# Patient Record
Sex: Female | Born: 1985 | Race: White | Hispanic: No | Marital: Single | Smoking: Former smoker
Health system: Southern US, Community
[De-identification: ages and names within clinical notes are randomized; demographics above are authoritative.]

## PROBLEM LIST (undated history)

## (undated) DIAGNOSIS — F32A Depression, unspecified: Secondary | ICD-10-CM

## (undated) DIAGNOSIS — O0930 Supervision of pregnancy with insufficient antenatal care, unspecified trimester: Secondary | ICD-10-CM

## (undated) DIAGNOSIS — F419 Anxiety disorder, unspecified: Secondary | ICD-10-CM

## (undated) DIAGNOSIS — F329 Major depressive disorder, single episode, unspecified: Secondary | ICD-10-CM

## (undated) DIAGNOSIS — E669 Obesity, unspecified: Secondary | ICD-10-CM

## (undated) HISTORY — DX: Supervision of pregnancy with insufficient antenatal care, unspecified trimester: O09.30

## (undated) HISTORY — DX: Depression, unspecified: F32.A

## (undated) HISTORY — DX: Major depressive disorder, single episode, unspecified: F32.9

## (undated) HISTORY — PX: WISDOM TOOTH EXTRACTION: SHX21

## (undated) HISTORY — DX: Anxiety disorder, unspecified: F41.9

## (undated) HISTORY — DX: Obesity, unspecified: E66.9

---

## 2012-06-14 NOTE — L&D Delivery Note (Signed)
Delivery Note At 10:37 PM a viable female was delivered via Vaginal, Spontaneous Delivery (Presentation: Left Occiput Anterior).  Shoulders noted to be tight following 'turtle sign'; shoulder dystocia released by Suprapubic pressure, McRoberts maneuver, and Woodscrew, lasting 1 minute 20 seconds total. APGAR: 1, 9; weight: pending.  Placenta status: Intact, Spontaneous.  Cord:  3 vessel cord with the following complications: None.  Cord pH: 7.322.    Anesthesia: Epidural  Episiotomy: None Lacerations: None Est. Blood Loss (mL): 350  Mom to postpartum.  Baby to NICU.  Selena Lesser 03/14/2013, 11:06 PM  I have seen and examined this patient and I agree with the above. SHAW, KIMBERLY 3:10 AM 03/15/2013

## 2013-01-15 ENCOUNTER — Other Ambulatory Visit (HOSPITAL_COMMUNITY): Payer: Self-pay | Admitting: Family

## 2013-01-15 DIAGNOSIS — Z3689 Encounter for other specified antenatal screening: Secondary | ICD-10-CM

## 2013-01-15 LAB — OB RESULTS CONSOLE RPR: RPR: NONREACTIVE

## 2013-01-15 LAB — OB RESULTS CONSOLE ANTIBODY SCREEN: Antibody Screen: NEGATIVE

## 2013-01-15 LAB — OB RESULTS CONSOLE ABO/RH: RH Type: NEGATIVE

## 2013-01-15 LAB — OB RESULTS CONSOLE HEPATITIS B SURFACE ANTIGEN: Hepatitis B Surface Ag: NEGATIVE

## 2013-01-16 ENCOUNTER — Ambulatory Visit (HOSPITAL_COMMUNITY)
Admission: RE | Admit: 2013-01-16 | Discharge: 2013-01-16 | Disposition: A | Discharge status: Home / Self Care | Payer: Medicaid Other | Source: Ambulatory Visit | Attending: Family | Admitting: Family

## 2013-01-16 DIAGNOSIS — Z3689 Encounter for other specified antenatal screening: Secondary | ICD-10-CM | POA: Insufficient documentation

## 2013-01-18 LAB — OB RESULTS CONSOLE GBS: GBS: POSITIVE

## 2013-01-29 ENCOUNTER — Other Ambulatory Visit (HOSPITAL_COMMUNITY): Payer: Self-pay | Admitting: Physician Assistant

## 2013-01-29 DIAGNOSIS — Z0489 Encounter for examination and observation for other specified reasons: Secondary | ICD-10-CM

## 2013-02-01 ENCOUNTER — Ambulatory Visit (HOSPITAL_COMMUNITY)
Admission: RE | Admit: 2013-02-01 | Discharge: 2013-02-01 | Disposition: A | Discharge status: Home / Self Care | Payer: Medicaid Other | Source: Ambulatory Visit | Attending: Physician Assistant | Admitting: Physician Assistant

## 2013-02-01 DIAGNOSIS — Z0489 Encounter for examination and observation for other specified reasons: Secondary | ICD-10-CM

## 2013-02-01 DIAGNOSIS — Z3689 Encounter for other specified antenatal screening: Secondary | ICD-10-CM | POA: Insufficient documentation

## 2013-02-01 DIAGNOSIS — O0930 Supervision of pregnancy with insufficient antenatal care, unspecified trimester: Secondary | ICD-10-CM | POA: Insufficient documentation

## 2013-03-05 ENCOUNTER — Other Ambulatory Visit (HOSPITAL_COMMUNITY): Payer: Self-pay | Admitting: Physician Assistant

## 2013-03-05 DIAGNOSIS — O480 Post-term pregnancy: Secondary | ICD-10-CM

## 2013-03-08 ENCOUNTER — Encounter (HOSPITAL_COMMUNITY): Payer: Self-pay

## 2013-03-08 ENCOUNTER — Other Ambulatory Visit (HOSPITAL_COMMUNITY): Payer: Self-pay | Admitting: Physician Assistant

## 2013-03-08 ENCOUNTER — Ambulatory Visit (HOSPITAL_COMMUNITY)
Admission: RE | Admit: 2013-03-08 | Discharge: 2013-03-08 | Disposition: A | Discharge status: Home / Self Care | Payer: Medicaid Other | Source: Ambulatory Visit | Attending: Physician Assistant | Admitting: Physician Assistant

## 2013-03-08 ENCOUNTER — Ambulatory Visit (HOSPITAL_COMMUNITY): Admission: RE | Admit: 2013-03-08 | Payer: Medicaid Other | Source: Ambulatory Visit

## 2013-03-08 DIAGNOSIS — Z3689 Encounter for other specified antenatal screening: Secondary | ICD-10-CM | POA: Insufficient documentation

## 2013-03-08 DIAGNOSIS — O480 Post-term pregnancy: Secondary | ICD-10-CM

## 2013-03-08 IMAGING — US US FETAL BPP W/O NONSTRESS
1 series · 13 of 16 positions shown · non-contrast
Comparison: none

[Series 1: us fetal bpp w/o nonstress · non-contrast · 16 acquisitions, 13 frames shown]
[im 1/16]
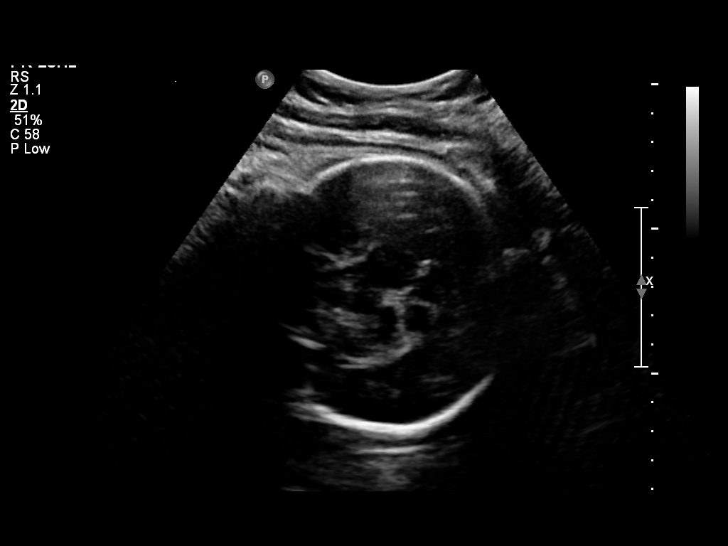
[im 2/16]
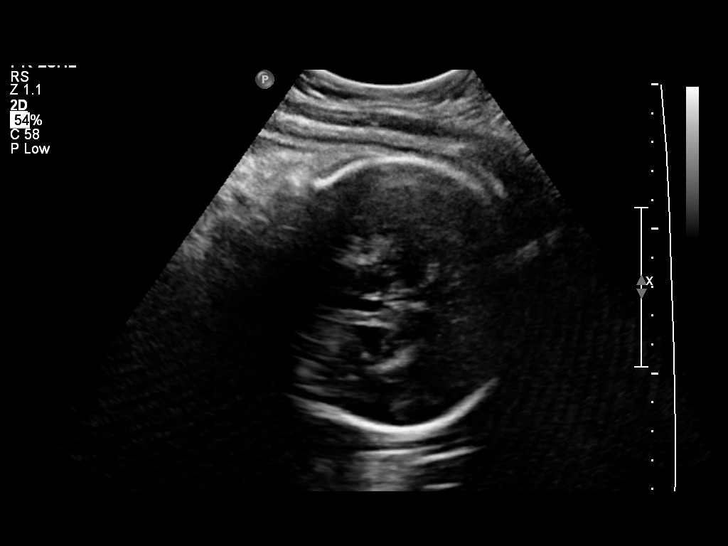
[im 4/16]
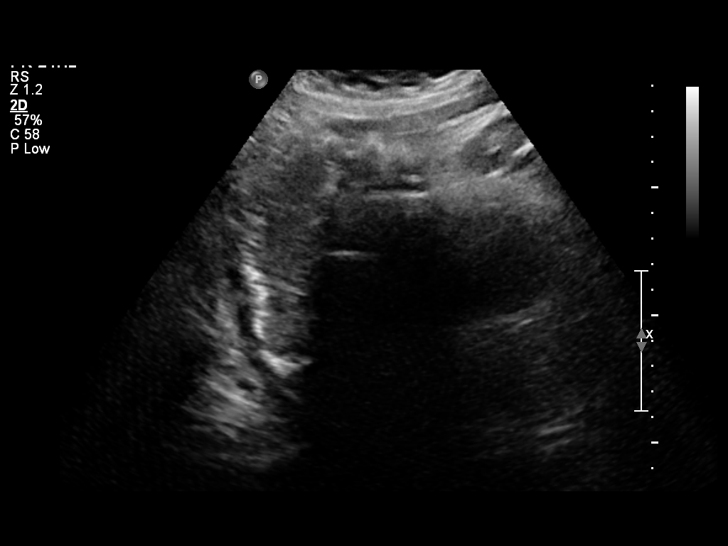
[im 5/16]
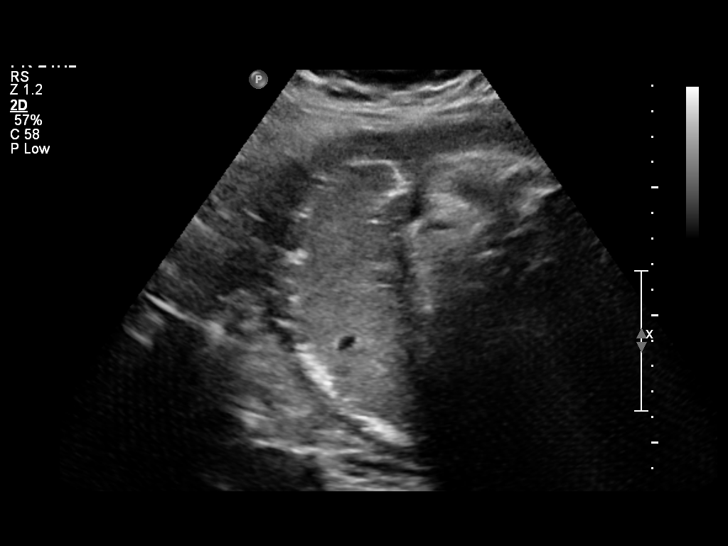
[im 6/16]
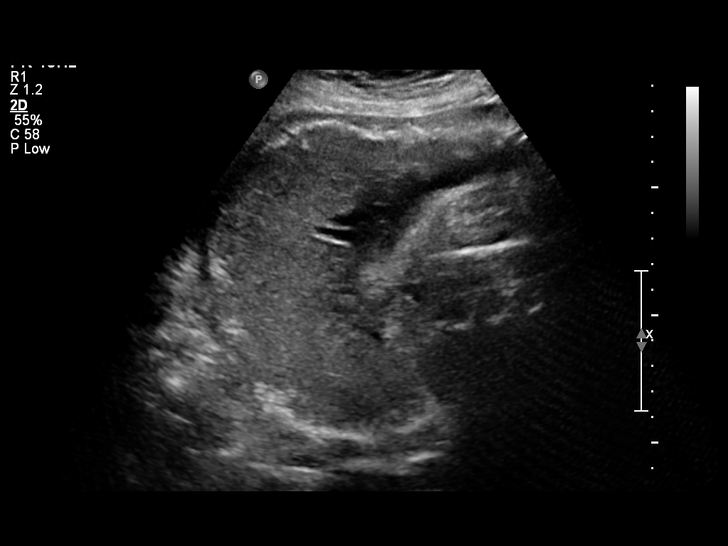
[im 7/16]
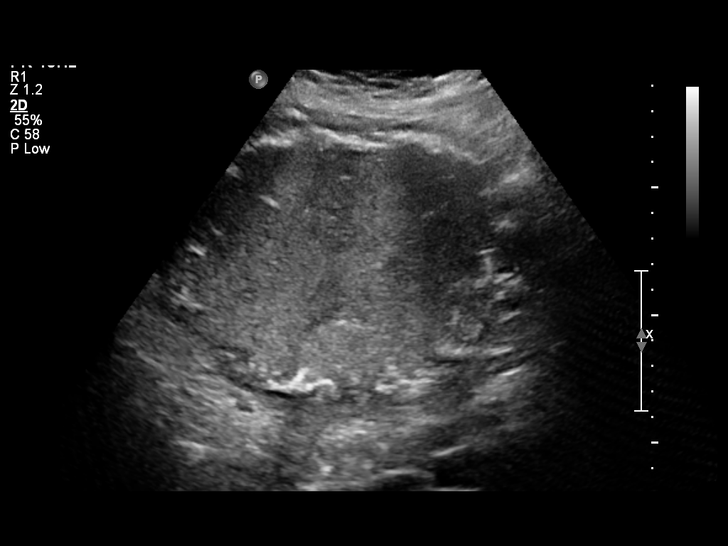
[im 9/16]
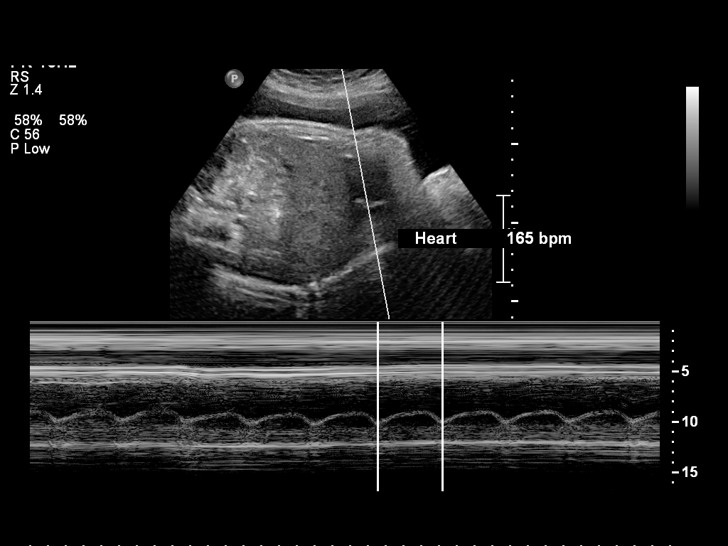
[im 10/16]
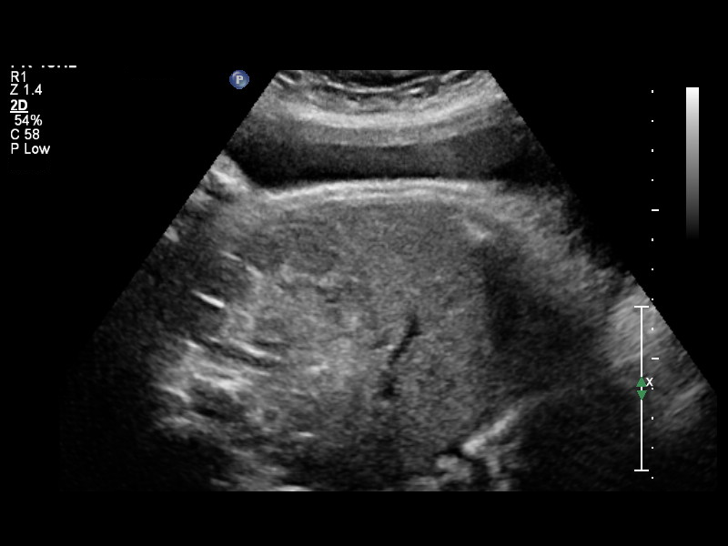
[im 11/16]
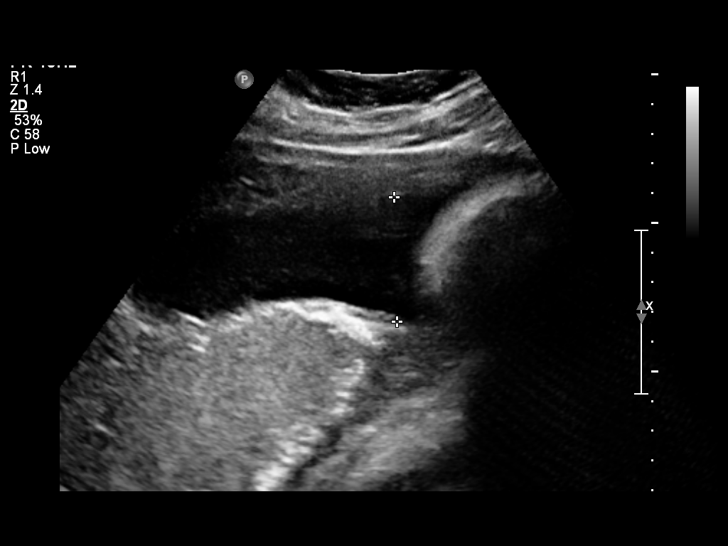
[im 12/16]
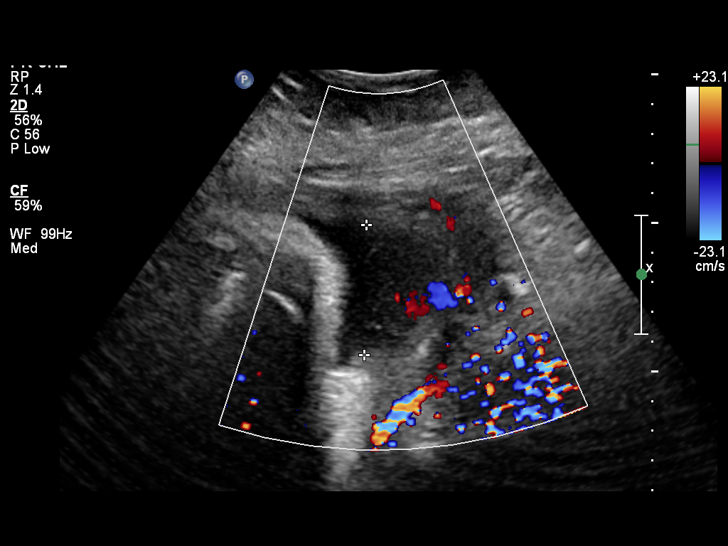
[im 13/16]
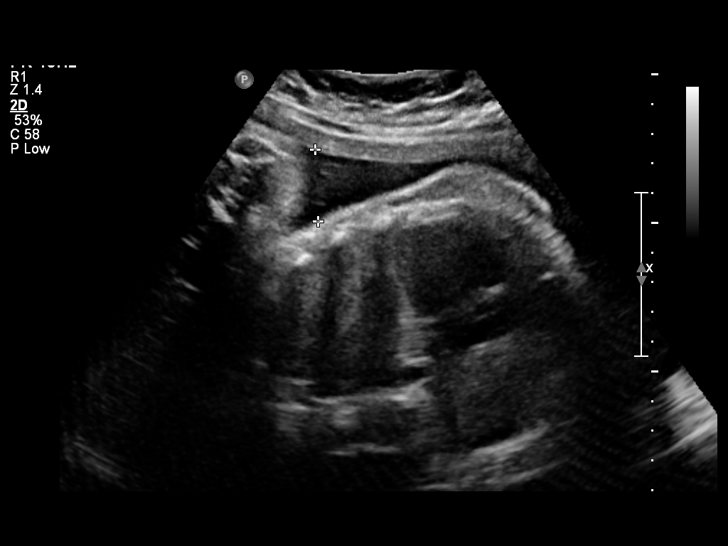
[im 15/16]
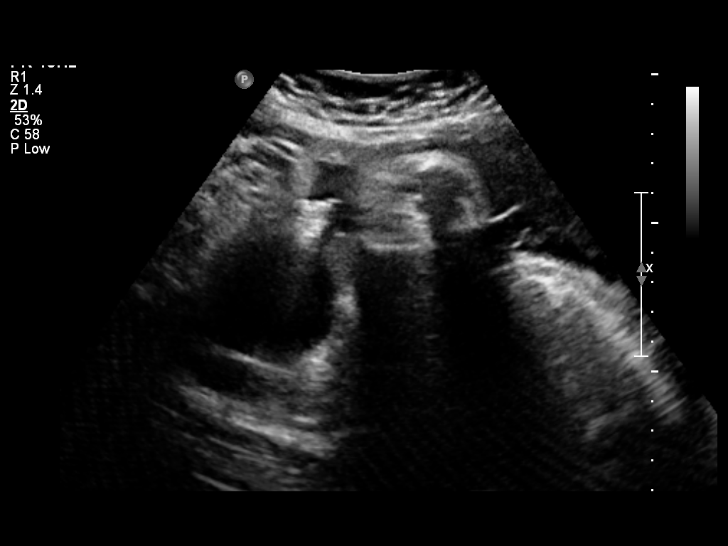
[im 16/16]
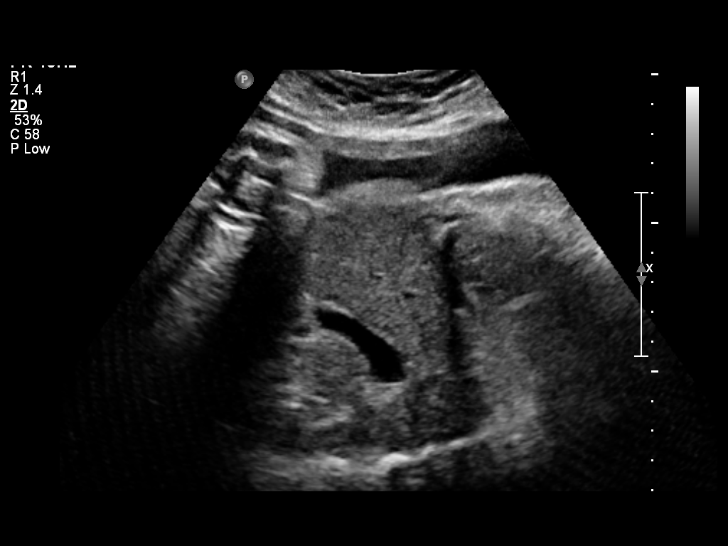

[13 of 16 positions shown; findings below may reference images not displayed]

OBSTETRICS REPORT
                      (Signed Final [DATE] [DATE])

Service(s) Provided

 [HOSPITAL]                                         76815.0
Indications

 Postdate pregnancy (40-42 weeks)
Fetal Evaluation

 Num Of Fetuses:    1
 Fetal Heart Rate:  165                          bpm
 Cardiac Activity:  Observed
 Presentation:      Cephalic
 Placenta:          Posterior Fundal, above
                    cervical os

 Amniotic Fluid
 AFI FV:      Subjectively within normal limits
 AFI Sum:     14.04   cm       61  %Tile      Larg Pckt:   4.36  cm
 RUQ:   2.41    cm   RLQ:    4.17   cm    LUQ:   3.1     cm    LLQ:   4.36    cm
Biophysical Evaluation

 Amniotic F.V:   Within normal limits       F. Tone:         Observed
 F. Movement:    Observed                   Score:           [DATE]
 F. Breathing:   Observed
Gestational Age

 LMP:           35w 4d        Date:  [DATE]                 EDD:   [DATE]
 Best:          40w 3d     Det. By:  U/S ([DATE])           EDD:   [DATE]
Anatomy

 Stomach:          Appears normal, left   Bladder:          Appears normal
                   sided
Impression

 Single IUP at 40 [DATE] weeks
 Active fetus with BPP of [DATE]
 Normal amniotic fluid volume
Recommendations

 Follow-up ultrasounds as clinically indicated.

 questions or concerns.

## 2013-03-12 ENCOUNTER — Telehealth (HOSPITAL_COMMUNITY): Payer: Self-pay | Admitting: *Deleted

## 2013-03-12 ENCOUNTER — Encounter (HOSPITAL_COMMUNITY): Payer: Self-pay | Admitting: *Deleted

## 2013-03-12 NOTE — Telephone Encounter (Signed)
Preadmission screen  

## 2013-03-13 ENCOUNTER — Inpatient Hospital Stay (HOSPITAL_COMMUNITY)
Admission: RE | Admit: 2013-03-13 | Discharge: 2013-03-16 | DRG: 774 | Disposition: A | Discharge status: Home / Self Care | Payer: Medicaid Other | Source: Ambulatory Visit | Attending: Family Medicine | Admitting: Family Medicine

## 2013-03-13 VITALS — BP 122/66 | HR 81 | Temp 98.2°F | Resp 18 | Ht 64.0 in | Wt 237.0 lb

## 2013-03-13 DIAGNOSIS — O99892 Other specified diseases and conditions complicating childbirth: Secondary | ICD-10-CM | POA: Diagnosis present

## 2013-03-13 DIAGNOSIS — O480 Post-term pregnancy: Principal | ICD-10-CM | POA: Diagnosis present

## 2013-03-13 DIAGNOSIS — Z22330 Carrier of Group B streptococcus: Secondary | ICD-10-CM

## 2013-03-13 DIAGNOSIS — IMO0001 Reserved for inherently not codable concepts without codable children: Secondary | ICD-10-CM

## 2013-03-13 DIAGNOSIS — O0930 Supervision of pregnancy with insufficient antenatal care, unspecified trimester: Secondary | ICD-10-CM

## 2013-03-13 DIAGNOSIS — O411090 Infection of amniotic sac and membranes, unspecified, unspecified trimester, not applicable or unspecified: Secondary | ICD-10-CM | POA: Diagnosis present

## 2013-03-13 LAB — CBC
HCT: 36.9 % (ref 36.0–46.0)
MCH: 32.3 pg (ref 26.0–34.0)
Platelets: 172 10*3/uL (ref 150–400)
RBC: 4 MIL/uL (ref 3.87–5.11)
RDW: 12.6 % (ref 11.5–15.5)
WBC: 8.3 10*3/uL (ref 4.0–10.5)

## 2013-03-13 LAB — TYPE AND SCREEN
ABO/RH(D): O NEG
Antibody Screen: NEGATIVE

## 2013-03-13 LAB — ABO/RH: ABO/RH(D): O NEG

## 2013-03-13 MED ORDER — LACTATED RINGERS IV SOLN
INTRAVENOUS | Status: DC
  Administered 2013-03-13 – 2013-03-14 (×2): via INTRAVENOUS

## 2013-03-13 MED ORDER — OXYCODONE-ACETAMINOPHEN 5-325 MG PO TABS: 1.0000 | ORAL_TABLET | ORAL | Status: DC | PRN

## 2013-03-13 MED ORDER — PENICILLIN G POTASSIUM 5000000 UNITS IJ SOLR
5.0000 10*6.[IU] | Freq: Once | INTRAVENOUS | Status: AC
  Administered 2013-03-13: 5 10*6.[IU] via INTRAVENOUS
  Filled 2013-03-13: qty 5

## 2013-03-13 MED ORDER — ZOLPIDEM TARTRATE 5 MG PO TABS: 5.0000 mg | ORAL_TABLET | Freq: Every evening | ORAL | Status: DC | PRN

## 2013-03-13 MED ORDER — PENICILLIN G POTASSIUM 5000000 UNITS IJ SOLR
2.5000 10*6.[IU] | INTRAVENOUS | Status: DC
  Administered 2013-03-13 – 2013-03-14 (×8): 2.5 10*6.[IU] via INTRAVENOUS
  Filled 2013-03-13 (×11): qty 2.5

## 2013-03-13 MED ORDER — LIDOCAINE HCL (PF) 1 % IJ SOLN
30.0000 mL | INTRAMUSCULAR | Status: DC | PRN
Start: 2013-03-13 — End: 2013-03-15
  Filled 2013-03-13 (×2): qty 30

## 2013-03-13 MED ORDER — OXYTOCIN 40 UNITS IN LACTATED RINGERS INFUSION - SIMPLE MED
1.0000 m[IU]/min | INTRAVENOUS | Status: DC
  Administered 2013-03-13: 1 m[IU]/min via INTRAVENOUS
  Administered 2013-03-13: 5 m[IU]/min via INTRAVENOUS
  Filled 2013-03-13: qty 1000

## 2013-03-13 MED ORDER — ONDANSETRON HCL 4 MG/2ML IJ SOLN: 4.0000 mg | Freq: Four times a day (QID) | INTRAMUSCULAR | Status: DC | PRN

## 2013-03-13 MED ORDER — ACETAMINOPHEN 325 MG PO TABS
650.0000 mg | ORAL_TABLET | ORAL | Status: DC | PRN
  Administered 2013-03-14: 650 mg via ORAL
  Administered 2013-03-14: 325 mg via ORAL
  Filled 2013-03-13: qty 1
  Filled 2013-03-13: qty 2

## 2013-03-13 MED ORDER — OXYTOCIN BOLUS FROM INFUSION
500.0000 mL | INTRAVENOUS | Status: DC
  Administered 2013-03-14: 500 mL via INTRAVENOUS

## 2013-03-13 MED ORDER — IBUPROFEN 600 MG PO TABS
600.0000 mg | ORAL_TABLET | Freq: Four times a day (QID) | ORAL | Status: DC | PRN
  Administered 2013-03-14: 600 mg via ORAL
  Filled 2013-03-13: qty 1

## 2013-03-13 MED ORDER — LACTATED RINGERS IV SOLN
500.0000 mL | INTRAVENOUS | Status: DC | PRN
  Administered 2013-03-14: 300 mL via INTRAVENOUS

## 2013-03-13 MED ORDER — OXYTOCIN 40 UNITS IN LACTATED RINGERS INFUSION - SIMPLE MED
62.5000 mL/h | INTRAVENOUS | Status: DC
  Filled 2013-03-13: qty 1000

## 2013-03-13 MED ORDER — CITRIC ACID-SODIUM CITRATE 334-500 MG/5ML PO SOLN: 30.0000 mL | ORAL | Status: DC | PRN

## 2013-03-13 NOTE — H&P (Signed)
Jasmine Roberts is a 27 y.o. female presenting for induction of labor for postdates. [facility].  No complications during pregnancy.  Presented late to care.  Pregnancy dated by 33 wk ultrasound. History OB History   Grav Para Term Preterm Abortions TAB SAB Ect Mult Living   1              Past Medical History  Diagnosis Date  . Anxiety   . Depression   . Obesity   . Late prenatal care    Past Surgical History  Procedure Laterality Date  . Wisdom tooth extraction     Family History: family history includes Cancer in her maternal grandmother and mother; Diabetes in her paternal grandfather; Heart disease in her father; Miscarriages / [place] in her mother. There is no history of Alcohol abuse, Arthritis, Asthma, Birth defects, COPD, Depression, Drug abuse, Early death, Hearing loss, Hyperlipidemia, Hypertension, Kidney disease, Learning disabilities, Mental illness, Mental retardation, Stroke, or Vision loss. Social History:  reports that she quit smoking about 2 weeks ago. She has never used smokeless tobacco. She reports that  drinks alcohol. She reports that she does not use illicit drugs.   Prenatal Transfer Tool   Review of Systems  Gastrointestinal: Positive for abdominal pain (intermittent contractions).  All other systems reviewed and are negative.    Dilation: 2 Effacement (%): 50 Station: -2 Exam by:: Wal[place], CNM Blood pressure 124/82, pulse 93, temperature 98.2 F (36.8 C), temperature source Oral, resp. rate 18, height 5\' 4"  (1.626 m), weight 107.502 kg (237 lb), last menstrual period 07/02/2012. Maternal Exam:  Abdomen: Estimated fetal weight is 8.5-9lbs.   Fetal presentation: vertex  Introitus: Vagina is positive for vaginal discharge (mucusy).    Fetal Exam Fetal Monitor Review: Baseline rate: 141.  Variability: moderate (6-25 bpm).   Pattern: accelerations present.    Fetal State Assessment: Category I - tracings are  normal.     Physical Exam  Constitutional: She is oriented to person, place, and time. She appears well-developed and well-nourished.  HENT:  Head: Normocephalic.  Neck: Normal range of motion. Neck supple.  Cardiovascular: Normal rate, regular rhythm and normal heart sounds.   Respiratory: Effort normal and breath sounds normal.  GI: Soft. There is no tenderness.  Genitourinary: No bleeding around the vagina. Vaginal discharge (mucusy) found.  Musculoskeletal: Normal range of motion. She exhibits no edema.  Neurological: She is alert and oriented to person, place, and time.  Skin: Skin is warm and dry.    Prenatal labs: ABO, Rh: O/Negative/-- (08/04 0000) Antibody: Negative (08/04 0000) Rubella: Immune (08/04 0000) RPR: Nonreactive (08/04 0000)  HBsAg: Negative (08/04 0000)  HIV: Non-reactive (08/04 0000)  GBS: Positive (08/07 0000)   Foley bulb placed without difficulty.  Assessment/Plan: 27 yo G1P0 at [redacted]w[redacted]d weeks IUP Postdates Induction of Labor GBS positive  Plan: Admit to Birthing Suites Foley Bulb Placement PCN for GBS Anticipate NSVD  [facility] 03/13/2013, 9:49 AM

## 2013-03-13 NOTE — Progress Notes (Signed)
   Subjective: Pt reports slight discomfort.   Objective: BP 134/84  Pulse 94  Temp(Src) 98.3 F (36.8 C) (Oral)  Resp 18  Ht 5\' 4"  (1.626 m)  Wt 107.502 kg (237 lb)  BMI 40.66 kg/m2  LMP 07/02/2012      FHT: Doppler 140's UC:   unknown SVE:   Dilation: 5-6 Effacement (%): 80 Station: -3 Exam by:: Frank.Kidd, CNM  Labs: Lab Results  Component Value Date   WBC 8.3 03/13/2013   HGB 12.9 03/13/2013   HCT 36.9 03/13/2013   MCV 92.3 03/13/2013   PLT 172 03/13/2013   Foley bulb in vagina, removed without difficulty. Assessment / Plan: Augmentation of labor, progressing well  Labor: Progressing normally Preeclampsia:  n/a Fetal Wellbeing:  Category I Pain Control:  Labor support without medications I/D:  GBS pos Begin Pitocin augmentation. Anticipated MOD:  NSVD  [facility] 03/13/2013, 4:16 PM

## 2013-03-13 NOTE — H&P (Signed)
Attestation of Attending Supervision of Advanced Practitioner (CNM/NP): Evaluation and management procedures were performed by the Advanced Practitioner under my supervision and collaboration.  I have reviewed the Advanced Practitioner's note and chart, and I agree with the management and plan.  Deandria Klute 03/13/2013 5:07 PM   

## 2013-03-13 NOTE — Progress Notes (Signed)
Jasmine Roberts is a 27 y.o. G1P0 at [redacted]w[redacted]d by  admitted for induction of labor due to Post dates. Due date 03/05/13.  Subjective:  Feeling well. Not feeling contractions at this time.   Objective: BP 123/80  Pulse 96  Temp(Src) 97.8 F (36.6 C) (Oral)  Resp 18  Ht 5\' 4"  (1.626 m)  Wt 107.502 kg (237 lb)  BMI 40.66 kg/m2  LMP 07/02/2012      FHT:  FHR: 145 bpm, variability: moderate,  accelerations:  Present,  decelerations:  Absent UC:   irregular, every 10-15 minutes SVE:   Dilation: 5.5 Effacement (%): 50 Station: -3 Exam by:: Frank.Kidd, CNM  Labs: Lab Results  Component Value Date   WBC 8.3 03/13/2013   HGB 12.9 03/13/2013   HCT 36.9 03/13/2013   MCV 92.3 03/13/2013   PLT 172 03/13/2013    Assessment / Plan: Induction of labor due to postterm,  progressing well on pitocin  Labor: still in latent phase Preeclampsia:  NA Fetal Wellbeing:  Category I Pain Control:  Labor support without medications I/D:  n/a Anticipated MOD:  NSVD  Tawnya Crook 03/13/2013, 9:13 PM

## 2013-03-14 ENCOUNTER — Inpatient Hospital Stay (HOSPITAL_COMMUNITY): Payer: Medicaid Other | Admitting: Anesthesiology

## 2013-03-14 ENCOUNTER — Encounter (HOSPITAL_COMMUNITY): Payer: Self-pay

## 2013-03-14 ENCOUNTER — Encounter (HOSPITAL_COMMUNITY): Payer: Self-pay | Admitting: Anesthesiology

## 2013-03-14 DIAGNOSIS — O411090 Infection of amniotic sac and membranes, unspecified, unspecified trimester, not applicable or unspecified: Secondary | ICD-10-CM

## 2013-03-14 DIAGNOSIS — O480 Post-term pregnancy: Secondary | ICD-10-CM

## 2013-03-14 MED ORDER — FENTANYL CITRATE 0.05 MG/ML IJ SOLN
INTRAMUSCULAR | Status: AC
  Administered 2013-03-14: 100 ug via INTRAVENOUS
  Filled 2013-03-14: qty 2

## 2013-03-14 MED ORDER — LACTATED RINGERS IV SOLN: 500.0000 mL | Freq: Once | INTRAVENOUS | Status: DC

## 2013-03-14 MED ORDER — LIDOCAINE HCL (PF) 1 % IJ SOLN
INTRAMUSCULAR | Status: DC | PRN
  Administered 2013-03-14: 6 mL
  Administered 2013-03-14: 9 mL

## 2013-03-14 MED ORDER — GENTAMICIN SULFATE 40 MG/ML IJ SOLN
170.0000 mg | Freq: Three times a day (TID) | INTRAVENOUS | Status: DC
  Administered 2013-03-14: 170 mg via INTRAVENOUS
  Filled 2013-03-14 (×2): qty 4.25

## 2013-03-14 MED ORDER — FENTANYL CITRATE 0.05 MG/ML IJ SOLN
100.0000 ug | INTRAMUSCULAR | Status: DC | PRN
  Administered 2013-03-14 (×2): 100 ug via INTRAVENOUS
  Filled 2013-03-14 (×2): qty 2

## 2013-03-14 MED ORDER — FENTANYL 2.5 MCG/ML BUPIVACAINE 1/10 % EPIDURAL INFUSION (WH - ANES)
14.0000 mL/h | INTRAMUSCULAR | Status: DC | PRN
  Filled 2013-03-14: qty 125

## 2013-03-14 MED ORDER — EPHEDRINE 5 MG/ML INJ
10.0000 mg | INTRAVENOUS | Status: DC | PRN
  Filled 2013-03-14: qty 2

## 2013-03-14 MED ORDER — PHENYLEPHRINE 40 MCG/ML (10ML) SYRINGE FOR IV PUSH (FOR BLOOD PRESSURE SUPPORT)
80.0000 ug | PREFILLED_SYRINGE | INTRAVENOUS | Status: DC | PRN
  Filled 2013-03-14: qty 2

## 2013-03-14 MED ORDER — FENTANYL 2.5 MCG/ML BUPIVACAINE 1/10 % EPIDURAL INFUSION (WH - ANES)
INTRAMUSCULAR | Status: DC | PRN
  Administered 2013-03-14: 14 mL/h via EPIDURAL
  Administered 2013-03-14: 21:00:00

## 2013-03-14 MED ORDER — FENTANYL 2.5 MCG/ML BUPIVACAINE 1/10 % EPIDURAL INFUSION (WH - ANES)
INTRAMUSCULAR | Status: AC
  Filled 2013-03-14: qty 125

## 2013-03-14 MED ORDER — DIPHENHYDRAMINE HCL 50 MG/ML IJ SOLN: 12.5000 mg | INTRAMUSCULAR | Status: DC | PRN

## 2013-03-14 MED ORDER — SODIUM CHLORIDE 0.9 % IV SOLN
2.0000 g | Freq: Four times a day (QID) | INTRAVENOUS | Status: DC
  Administered 2013-03-14: 2 g via INTRAVENOUS
  Filled 2013-03-14 (×2): qty 2000

## 2013-03-14 NOTE — Progress Notes (Signed)
Jasmine Roberts is a 27 y.o. G1P0 at [redacted]w[redacted]d admitted for induction of labor due to Post dates.  Subjective: Pt with moderate pain cotrol. AROM after last exam  Objective: BP 140/87  Pulse 84  Temp(Src) 98 F (36.7 C) (Oral)  Resp 20  Ht 5\' 4"  (1.626 m)  Wt 107.502 kg (237 lb)  BMI 40.66 kg/m2  LMP 07/02/2012      FHT:  FHR: 150s bpm, variability: minimal ,  accelerations:  Abscent,  decelerations:  Present Variable and early decels. One period of poor tracings. concern for possible prolonged but may have been maternal. Continuos monitoring UC:   irregular, every 2-5 minutes occassional couplets SVE:   Dilation: 7 Effacement (%): 100 Station: 0;-1 Exam by:: McGrail RN  Labs: Lab Results  Component Value Date   WBC 8.3 03/13/2013   HGB 12.9 03/13/2013   HCT 36.9 03/13/2013   MCV 92.3 03/13/2013   PLT 172 03/13/2013    Assessment / Plan: Induction of labor due to postterm,  progressing well on pitocin  Labor: Progressing normally and on pitocin. S/p AROM after last check by nurse. Preeclampsia:  no signs or symptoms of toxicity elevated BP with pain at 140, will not draw labs at this time as attributing to pain and imrpoved with pain medication. If persistent will draw labs Fetal Wellbeing:  Category II and needs continuous monitoring.  Pain Control:  Fentanyl I/D:  GBS + on PCN Anticipated MOD:  NSVD  Brynden Thune RYAN 03/14/2013, 10:27 AM

## 2013-03-14 NOTE — Progress Notes (Signed)
Jasmine Roberts is a 27 y.o. G1P0 at [redacted]w[redacted]d admitted for induction of labor due to Post dates..  Subjective: Painful ctx, helped some by IV meds, declines epidural  Objective: BP 139/65  Pulse 97  Temp(Src) 98 F (36.7 C) (Oral)  Resp 18  Ht 5\' 4"  (1.626 m)  Wt 107.502 kg (237 lb)  BMI 40.66 kg/m2  LMP 07/02/2012      FHT:  FHR: 150s bpm, variability: moderate,  accelerations:  Present,  decelerations:  Absent UC:   regular, every 3-5 minutes SVE:   Dilation: 9 Effacement (%): 100 Station: 0 Exam by:: McGrail RN  Labs: Lab Results  Component Value Date   WBC 8.3 03/13/2013   HGB 12.9 03/13/2013   HCT 36.9 03/13/2013   MCV 92.3 03/13/2013   PLT 172 03/13/2013    Assessment / Plan: Induction of labor due to postterm,  progressing well on pitocin  Labor: Progressing normally on pitocin. Making chagne  Preeclampsia:  no signs or symptoms of toxicity Fetal Wellbeing:  Category I continous monitoring Pain Control:  Fentanyl I/D:  GBS Positive on PCN Anticipated MOD:  NSVD  Henritta Mutz RYAN 03/14/2013, 12:49 PM

## 2013-03-14 NOTE — Progress Notes (Signed)
Jasmine Roberts is a 27 y.o. G1P0 at [redacted]w[redacted]d for IOL for PD.  Subjective: Feeling pressure. Pit at 26 milliunits.  Objective: BP 156/98  Pulse 161  Temp(Src) 100.4 F (38 C) (Axillary)  Resp 20  Ht 5\' 4"  (1.626 m)  Wt 237 lb (107.502 kg)  BMI 40.66 kg/m2  LMP 07/02/2012      FHT:  FHR: 160 bpm, variability: moderate,  accelerations:  Present,  decelerations:  Present occasional variable UC:   regular, every 2 minutes SVE:   Dilation: Lip/rim Effacement (%): 100 Station: 0;+1 Exam by:: Shawnie Pons MD Pushed with contraction--infant vertex brought down to +2-+3 Labs: Lab Results  Component Value Date   WBC 8.3 03/13/2013   HGB 12.9 03/13/2013   HCT 36.9 03/13/2013   MCV 92.3 03/13/2013   PLT 172 03/13/2013    Assessment / Plan: Protracted active phase  Labor: Likely OP-improved pain and station with pushing. Fetal Wellbeing:  Category II Pain Control:  Epidural I/D:  Chorioamnionitis-started on Amp and Gent Anticipated MOD:  NSVD attempt pushing and see if pt. Can deliver.  Marshal Eskew S 03/14/2013, 9:15 PM

## 2013-03-14 NOTE — Progress Notes (Signed)
LILANA BLASKO is a 27 y.o. G1P0 at [redacted]w[redacted]d by ultrasound admitted for induction of labor due to Post dates. Due date 03/05/2013.  Subjective: Feeling pressure  Objective: BP 123/67  Pulse 130  Temp(Src) 99.4 F (37.4 C) (Oral)  Resp 18  Ht 5\' 4"  (1.626 m)  Wt 237 lb (107.502 kg)  BMI 40.66 kg/m2  LMP 07/02/2012      FHT:  FHR: 150 bpm, variability: moderate,  accelerations:  Present,  decelerations:  Absent UC:   regular, every 2-3 minutes. Very inadequate SVE:  Ant lip, thin, baby feels OP.    Labs: Lab Results  Component Value Date   WBC 8.3 03/13/2013   HGB 12.9 03/13/2013   HCT 36.9 03/13/2013   MCV 92.3 03/13/2013   PLT 172 03/13/2013    Assessment / Plan: Protracted active phase, related to OP position and inadequate labor.  Fetal Wellbeing:  Category I Pain Control:  Epidural Anticipated MOD:  will re-check in an hour and increase pitocin to 30 mu if needed.  Recheck soon and make decision about ability to deliver.  PRATT,TANYA S 03/14/2013, 7:33 PM

## 2013-03-14 NOTE — Anesthesia Procedure Notes (Addendum)
Epidural Patient location during procedure: OB Start time: 03/14/2013 3:10 PM End time: 03/14/2013 3:14 PM  Staffing Anesthesiologist: Sandrea Hughs Performed by: anesthesiologist   Preanesthetic Checklist Completed: patient identified, surgical consent, pre-op evaluation, timeout performed, IV checked, risks and benefits discussed and monitors and equipment checked  Epidural Patient position: sitting Prep: site prepped and draped and DuraPrep Patient monitoring: continuous pulse ox and blood pressure Approach: midline Injection technique: LOR air  Needle:  Needle type: Tuohy  Needle gauge: 17 G Needle length: 9 cm and 9 Needle insertion depth: 5 cm cm Catheter type: closed end flexible Catheter size: 19 Gauge Catheter at skin depth: 10 cm Test dose: negative and Other  Assessment Sensory level: T10 Events: blood not aspirated, injection not painful, no injection resistance, negative IV test and no paresthesia  Additional Notes Reason for block:procedure for pain

## 2013-03-14 NOTE — Progress Notes (Signed)
Jasmine Roberts is a 27 y.o. G1P0 at [redacted]w[redacted]d admitted for IOL 2/2 postdates Subjective:  No complaints. Sleeping off and on. Contractions are not very painful.  Objective: BP 132/85  Pulse 88  Temp(Src) 97.7 F (36.5 C) (Oral)  Resp 18  Ht 5\' 4"  (1.626 m)  Wt 107.502 kg (237 lb)  BMI 40.66 kg/m2  LMP 07/02/2012      FHT:  FHR: 145 bpm, variability: moderate,  accelerations:  Present,  decelerations:  Absent UC:   regular, every 4-6 minutes SVE:   Dilation:  (no change) Effacement (%): 50 Station: -3 Exam by:: Frank.Kidd, CNM  Labs: Lab Results  Component Value Date   WBC 8.3 03/13/2013   HGB 12.9 03/13/2013   HCT 36.9 03/13/2013   MCV 92.3 03/13/2013   PLT 172 03/13/2013    Assessment / Plan: IOL on pitocin, no progress at this time. Still in latent phase.   Labor: latent phase  Preeclampsia:  NA Fetal Wellbeing:  Category I Pain Control:  Labor support without medications I/D:  n/a Anticipated MOD:  NSVD  Tawnya Crook 03/14/2013, 12:22 AM

## 2013-03-14 NOTE — Progress Notes (Signed)
Jasmine Roberts is a 27 y.o. G1P0 at [redacted]w[redacted]d admitted for IOL 2/2 postdates Subjective:  Feeling a lot of rectal pressure  Objective: BP 161/79  Pulse 96  Temp(Src) 98 F (36.7 C) (Oral)  Resp 18  Ht 5\' 4"  (1.626 m)  Wt 107.502 kg (237 lb)  BMI 40.66 kg/m2  LMP 07/02/2012      FHT:  FHR: 145 bpm, variability: moderate,  accelerations:  Present,  decelerations:  Absent UC:   regular, every 2 minutes SVE:  6-7/100/-1 Labs: Lab Results  Component Value Date   WBC 8.3 03/13/2013   HGB 12.9 03/13/2013   HCT 36.9 03/13/2013   MCV 92.3 03/13/2013   PLT 172 03/13/2013    Assessment / Plan: Induction of labor due to postterm,  progressing well on pitocin  Labor: Progressing normally Preeclampsia:  NA Fetal Wellbeing:  Category I Pain Control:  Labor support without medications I/D:  n/a Anticipated MOD:  NSVD  Tawnya Crook 03/14/2013, 4:55 AM

## 2013-03-14 NOTE — Progress Notes (Signed)
ANTIBIOTIC CONSULT NOTE - INITIAL  Pharmacy Consult for Gentamicin Indication: Chorioamnionitis   No Known Allergies  Patient Measurements: Height: 5\' 4"  (162.6 cm) Weight: 237 lb (107.502 kg) IBW/kg (Calculated) : 54.7 Adjusted Body Weight: 71 kg  Vital Signs: Temp: 100.4 F (38 C) (10/01 2001) Temp src: Axillary (10/01 2001) BP: 154/85 mmHg (10/01 2001) Pulse Rate: 141 (10/01 2001)  Labs:  Recent Labs  03/13/13 0757  WBC 8.3  HGB 12.9  PLT 172   Medications:  Ampicillin 2g IV q6h  Assessment: 27 y.o. female G1P0 at [redacted]w[redacted]d being initiated on ampicillin and gentamicin for fever during labor.   Estimated Ke = 0.32, Vd = 0.4L/kg  Goal of Therapy:  Gentamicin peak 6-8 mg/L and Trough < 1 mg/L  Plan:  Gentamicin 170 mg IV every 8 hrs  Check Scr with next labs if gentamicin continued. Will check gentamicin levels if continued > 72hr or clinically indicated.  Jasmine Roberts 03/14/2013,8:15 PM

## 2013-03-14 NOTE — Consult Note (Signed)
Neonatology Note:  Attendance at Code Apgar:  Our team responded to a Code Apgar call to room # 171 following NSVD, due to infant with apnea. The requesting physician was Dr. Pratt. The mother is a G1P0 O neg, GBS positive [facility]. She was being induced for post-dates, at 41 2/7 weeks, and had fever to 100.4 degrees during labor.She received Pen G > 4 hours prior to delivery, and also got a dose each of Amp and Gent about 2 hours before delivery. ROM occurred 19 hours PTD and the fluid was clear. There was a shoulder dystocia which took 1-1.5 min to get delivered. At delivery, the baby was apneic, blue, and had a HR of 80. The OB nursing staff in attendance gave vigorous stimulation and a Code Apgar was called. Our team arrived at 1.5 minutes of life, at which time the baby had been given PPV briefly, and was stunned, with glassy eyes and very irregular respirations, but HR > 100. We gave vigorous stimulation and did bulb suctioning. The baby had retractions and very coarse breath sounds bilaterally. We did chest PT and DeLee suctioning, removing about 4-5 ml thick, clear mucous. The baby continued to have rales and mild retractions, and her HR was 220-230. Ap 1/9. I spoke with the parents in the DR, then transported the baby to the NICU for further care, due to elevated risk for infection and respiratory distress.  Brenin Heidelberger C. Humza Tallerico, MD  

## 2013-03-14 NOTE — Progress Notes (Signed)
Jasmine Roberts is a 27 y.o. G1P0 at [redacted]w[redacted]d by ultrasound admitted for induction of labor due to Post dates.  Subjective:  Patient says she is comfortable. Denies any complaints.  Objective: BP 144/71  Pulse 140  Temp(Src) 99 F (37.2 C) (Oral)  Resp 18  Ht 5\' 4"  (1.626 m)  Wt 107.502 kg (237 lb)  BMI 40.66 kg/m2  LMP 07/02/2012      FHT:  FHR: 150 bpm, variability: moderate,  accelerations:  Abscent,  decelerations:  Absent UC:   regular, every 5 minutes SVE:   Dilation: Lip/rim Effacement (%): 100 Station: 0 Exam by:: McGrail RN  Labs: Lab Results  Component Value Date   WBC 8.3 03/13/2013   HGB 12.9 03/13/2013   HCT 36.9 03/13/2013   MCV 92.3 03/13/2013   PLT 172 03/13/2013    Assessment / Plan: Induction of labor due to postterm,  progressing well on pitocin  Labor: Progressing normally Preeclampsia:  no signs or symptoms of toxicity Fetal Wellbeing:  Category I Pain Control:  Epidural and Fentanyl I/D:  n/a Anticipated MOD:  NSVD  Bing Plume 03/14/2013, 4:39 PM  I spoke with and examined patient and agree with PA-S's note and plan of care.  Tawana Scale, MD Ob Fellow 03/14/2013 4:43 PM

## 2013-03-14 NOTE — Anesthesia Preprocedure Evaluation (Signed)
Anesthesia Evaluation  Patient identified by MRN, date of birth, ID band Patient awake    Reviewed: Allergy & Precautions, H&P , NPO status , Patient's Chart, lab work & pertinent test results  Airway Mallampati: II TM Distance: >3 FB Neck ROM: full    Dental no notable dental hx.    Pulmonary neg pulmonary ROS,  breath sounds clear to auscultation  Pulmonary exam normal       Cardiovascular negative cardio ROS      Neuro/Psych negative neurological ROS  negative psych ROS   GI/Hepatic negative GI ROS, Neg liver ROS,   Endo/Other  negative endocrine ROSMorbid obesity  Renal/GU negative Renal ROS     Musculoskeletal negative musculoskeletal ROS (+)   Abdominal (+) + obese,   Peds  Hematology negative hematology ROS (+)   Anesthesia Other Findings   Reproductive/Obstetrics (+) Pregnancy                           Anesthesia Physical Anesthesia Plan  ASA: III  Anesthesia Plan: Epidural   Post-op Pain Management:    Induction:   Airway Management Planned:   Additional Equipment:   Intra-op Plan:   Post-operative Plan:   Informed Consent: I have reviewed the patients History and Physical, chart, labs and discussed the procedure including the risks, benefits and alternatives for the proposed anesthesia with the patient or authorized representative who has indicated his/her understanding and acceptance.     Plan Discussed with:   Anesthesia Plan Comments:         Anesthesia Quick Evaluation

## 2013-03-14 NOTE — Progress Notes (Signed)
Patient is changing positions to help with progression of labor. Toco is unable to get readings from time to time. Contractions are moderate; Patient has adequate resting between contractions.   Tyerra Loretto, [place], RN, BSN 8:49 AM

## 2013-03-14 NOTE — Progress Notes (Signed)
Jasmine Roberts is a 27 y.o. G1P0 at [redacted]w[redacted]d admitted for IOL 2/2 postdates Subjective:  Coping well with contractions.  Objective: BP 126/78  Pulse 93  Temp(Src) 98 F (36.7 C) (Oral)  Resp 18  Ht 5\' 4"  (1.626 m)  Wt 107.502 kg (237 lb)  BMI 40.66 kg/m2  LMP 07/02/2012      FHT:  FHR: 150 bpm, variability: moderate,  accelerations:  Present,  decelerations:  Absent UC:   regular, every 2-3 minutes SVE:   6/90/ballotable Labs: Lab Results  Component Value Date   WBC 8.3 03/13/2013   HGB 12.9 03/13/2013   HCT 36.9 03/13/2013   MCV 92.3 03/13/2013   PLT 172 03/13/2013    Assessment / Plan: Induction of labor due to postterm,  progressing well on pitocin  Labor: Progressing normally and consider AROM when head is more applied to cervix.  Preeclampsia:  NA Fetal Wellbeing:  Category I Pain Control:  Labor support without medications I/D:  n/a Anticipated MOD:  NSVD  Tawnya Crook 03/14/2013, 2:30 AM

## 2013-03-15 ENCOUNTER — Encounter (HOSPITAL_COMMUNITY): Payer: Self-pay

## 2013-03-15 ENCOUNTER — Inpatient Hospital Stay (HOSPITAL_COMMUNITY): Admission: RE | Admit: 2013-03-15 | Payer: Medicaid Other | Source: Ambulatory Visit

## 2013-03-15 MED ORDER — OXYCODONE-ACETAMINOPHEN 5-325 MG PO TABS
1.0000 | ORAL_TABLET | ORAL | Status: DC | PRN
  Administered 2013-03-16: 1 via ORAL
  Filled 2013-03-15: qty 1

## 2013-03-15 MED ORDER — SIMETHICONE 80 MG PO CHEW: 80.0000 mg | CHEWABLE_TABLET | ORAL | Status: DC | PRN

## 2013-03-15 MED ORDER — RHO D IMMUNE GLOBULIN 1500 UNIT/2ML IJ SOLN
300.0000 ug | Freq: Once | INTRAMUSCULAR | Status: AC
  Administered 2013-03-16: 300 ug via INTRAMUSCULAR
  Filled 2013-03-15: qty 2

## 2013-03-15 MED ORDER — ONDANSETRON HCL 4 MG/2ML IJ SOLN: 4.0000 mg | INTRAMUSCULAR | Status: DC | PRN

## 2013-03-15 MED ORDER — SENNOSIDES-DOCUSATE SODIUM 8.6-50 MG PO TABS
2.0000 | ORAL_TABLET | ORAL | Status: DC
  Administered 2013-03-15: 2 via ORAL

## 2013-03-15 MED ORDER — INFLUENZA VAC SPLIT QUAD 0.5 ML IM SUSP
0.5000 mL | Freq: Once | INTRAMUSCULAR | Status: AC
  Administered 2013-03-16: 0.5 mL via INTRAMUSCULAR
  Filled 2013-03-15 (×2): qty 0.5

## 2013-03-15 MED ORDER — WITCH HAZEL-GLYCERIN EX PADS: 1.0000 "application " | MEDICATED_PAD | CUTANEOUS | Status: DC | PRN

## 2013-03-15 MED ORDER — PRENATAL MULTIVITAMIN CH
1.0000 | ORAL_TABLET | Freq: Every day | ORAL | Status: DC
  Administered 2013-03-15 – 2013-03-16 (×2): 1 via ORAL
  Filled 2013-03-15 (×2): qty 1

## 2013-03-15 MED ORDER — DIPHENHYDRAMINE HCL 25 MG PO CAPS: 25.0000 mg | ORAL_CAPSULE | Freq: Four times a day (QID) | ORAL | Status: DC | PRN

## 2013-03-15 MED ORDER — DIBUCAINE 1 % RE OINT: 1.0000 "application " | TOPICAL_OINTMENT | RECTAL | Status: DC | PRN

## 2013-03-15 MED ORDER — IBUPROFEN 600 MG PO TABS
600.0000 mg | ORAL_TABLET | Freq: Four times a day (QID) | ORAL | Status: DC
  Administered 2013-03-15 – 2013-03-16 (×6): 600 mg via ORAL
  Filled 2013-03-15 (×6): qty 1

## 2013-03-15 MED ORDER — LANOLIN HYDROUS EX OINT: TOPICAL_OINTMENT | CUTANEOUS | Status: DC | PRN

## 2013-03-15 MED ORDER — BENZOCAINE-MENTHOL 20-0.5 % EX AERO
1.0000 "application " | INHALATION_SPRAY | CUTANEOUS | Status: DC | PRN
  Administered 2013-03-15: 1 via TOPICAL
  Filled 2013-03-15: qty 56

## 2013-03-15 MED ORDER — ONDANSETRON HCL 4 MG PO TABS: 4.0000 mg | ORAL_TABLET | ORAL | Status: DC | PRN

## 2013-03-15 MED ORDER — ZOLPIDEM TARTRATE 5 MG PO TABS: 5.0000 mg | ORAL_TABLET | Freq: Every evening | ORAL | Status: DC | PRN

## 2013-03-15 MED ORDER — TETANUS-DIPHTH-ACELL PERTUSSIS 5-2.5-18.5 LF-MCG/0.5 IM SUSP: 0.5000 mL | Freq: Once | INTRAMUSCULAR | Status: DC

## 2013-03-15 NOTE — Progress Notes (Signed)
Post Partum Day 1 Subjective: no complaints, up ad lib, voiding, tolerating PO and + flatus  Objective: Blood pressure 124/73, pulse 109, temperature 98.2 F (36.8 C), temperature source Oral, resp. rate 18, height 5\' 4"  (1.626 m), weight 107.502 kg (237 lb), last menstrual period 07/02/2012, SpO2 100.00%, unknown if currently breastfeeding.  Physical Exam:  General: alert, cooperative and no distress Lochia: appropriate Uterine Fundus: firm Incision: na DVT Evaluation: No evidence of DVT seen on physical exam.   Recent Labs  03/13/13 0757  HGB 12.9  HCT 36.9    Assessment/Plan: Plan for discharge tomorrow, Breastfeeding and Contraception plans for mirena   LOS: 2 days   Caylor Cerino L 03/15/2013, 4:38 PM

## 2013-03-15 NOTE — Anesthesia Postprocedure Evaluation (Signed)
Anesthesia Post Note  Patient: Jasmine Roberts  Procedure(s) Performed: * No procedures listed *  Anesthesia type: Epidural  Patient location: Mother/Baby  Post pain: Pain level controlled  Post assessment: Post-op Vital signs reviewed  Last Vitals:  Filed Vitals:   03/15/13 1200  BP: 124/73  Pulse: 109  Temp: 36.8 C  Resp: 18    Post vital signs: Reviewed  Level of consciousness:alert  Complications: No apparent anesthesia complications

## 2013-03-16 LAB — KLEIHAUER-BETKE STAIN
Fetal Cells %: 0 %
Quantitation Fetal Hemoglobin: 0 mL

## 2013-03-16 MED ORDER — IBUPROFEN 600 MG PO TABS: 600.0000 mg | ORAL_TABLET | Freq: Four times a day (QID) | ORAL | Status: AC

## 2013-03-16 NOTE — Discharge Summary (Signed)
Obstetric Discharge Summary Reason for Admission: induction of labor for postdates Prenatal Procedures: NST Intrapartum Procedures: spontaneous vaginal delivery Postpartum Procedures: none Complications-Operative and Postpartum: Chorioamnionitis requiring NICU admission for infant; shoulder dystocia  Hemoglobin  Date Value Range Status  03/13/2013 12.9  12.0 - 15.0 g/dL Final     HCT  Date Value Range Status  03/13/2013 36.9  36.0 - 46.0 % Final   Brief Hospital Course: Patient was admitted or postdates induction, labor complicated by chorioamnionitis requiring NICU admission for infant. Delivery also complicated by shoulder dystocia, see delivery note below.  Delivery Note  At 10:37 PM a viable female was delivered via Vaginal, Spontaneous Delivery (Presentation: Left Occiput Anterior). Shoulders noted to be tight following 'turtle sign'; shoulder dystocia released by Suprapubic pressure, McRoberts maneuver, and [place], lasting 1 minute 20 seconds total. APGAR: 1, 9; weight: pending. Placenta status: Intact, Spontaneous. Cord: 3 vessel cord with the following complications: None. Cord pH: 7.322.  Anesthesia: Epidural  Episiotomy: None  Lacerations: None  Est. Blood Loss (mL): 350  Mom to postpartum. Baby to NICU.   Uncomplicated postpartum course.  Breastfeeding and plans Mirena IUD for contraception.  Discharged to home in stable condition on PPD#2.  Physical Exam:  BP 122/66  Pulse 81  Temp(Src) 98.2 F (36.8 C) (Oral)  Resp 18  Ht 5\' 4"  (1.626 m)  Wt 237 lb (107.502 kg)  BMI 40.66 kg/m2  SpO2 100%  LMP 07/02/2012 General: alert and no distress Lochia: appropriate Uterine Fundus: firm DVT Evaluation: No evidence of DVT seen on physical exam.  Discharge Diagnoses: Term Pregnancy-delivered and Amnionitis  Discharge Information: Date: 03/16/2013 Activity: as specified in discharge instructions Diet: routine Medications: PNV and Ibuprofen Condition:  stable Instructions: as specified in discharge instructions Discharge to: home Follow-up Information   Please follow up. [facility] manager health department for 6 week postpartum visit)       Newborn Data: Live born female  Birth Weight: 8 lb 3 oz (3715 g) APGAR: 1, 9 Admitted to NICU  Tereso Newcomer, MD 03/16/2013, 11:12 AM

## 2013-03-16 NOTE — Lactation Note (Signed)
This note was copied from the chart of Jasmine Tarryn Bogdan. Lactation Consultation Note    Follow up consult with this mom and baby. I assisted mom with latching baby in football hold. She latched with breast compression, difficult for mom to accomplish just yet, and suckled on and off for about 5 minutes. I then relatched her to mom with a 24 nipple shield, with some formula placed in shield. Elizabeth suckled  Until formula gone, and got restless. Mom then pc'd with a bottle of formula. Mom is being discharged to home today. She has a DEP at home, and knows to pump every 3 hours, for 15-30 minutes.   Mom has not pumped any milk , but is able to express a few large drops with hand expression. She is now 39 hours post partum. i will follow this mom and baby in the nICU.  Patient Name: Jasmine Roberts ZOXWR'U Date: 03/16/2013 Reason for consult: Follow-up assessment;NICU baby   Maternal Data    Feeding Feeding Type: Breast Milk  LATCH Score/Interventions Latch: Repeated attempts needed to sustain latch, nipple held in mouth throughout feeding, stimulation needed to elicit sucking reflex. Intervention(s): Adjust position;Assist with latch;Breast compression (large soft breast needing compression for latch )  Audible Swallowing: None Intervention(s): Hand expression;Skin to skin  Type of Nipple: Everted at rest and after stimulation  Comfort (Breast/Nipple): Soft / non-tender     Hold (Positioning): Assistance needed to correctly position infant at breast and maintain latch. Intervention(s): Breastfeeding basics reviewed;Support Pillows;Position options;Skin to skin  LATCH Score: 6  Lactation Tools Discussed/Used     Consult Status Consult Status: PRN Follow-up type:  (in patient)    Alfred Levins 03/16/2013, 2:24 PM

## 2013-03-16 NOTE — Progress Notes (Signed)
Clinical Social Work Department PSYCHOSOCIAL ASSESSMENT - MATERNAL/CHILD 03/15/2013  Patient:  Jasmine Roberts, Jasmine Roberts  Account Number:  [id number]  Admit Date:  03/13/2013  Marjo Bicker Name:   [name] Nelson-Birt    Clinical Social Worker:  Lulu Riding, [place]   Date/Time:  03/15/2013 03:30 PM  Date Referred:  03/15/2013   Referral source  NICU     Referred reason  NICU  [facility]   Other referral source:    I:  FAMILY / HOME ENVIRONMENT Child's legal guardian:  PARENT  Guardian - Name Guardian - Age Guardian - Address  Sritha Chauncey [address], [place], [place] [address]  Franchot Heidelberg  Does not live with MOB   Other household support members/support persons Name Relationship DOB  Roselie Awkward MOTHER    FATHER    Other support:   Great support system    II  PSYCHOSOCIAL DATA Information Source:  Family Interview  Surveyor, quantity and [employer] Employment:   Surveyor, quantity resources:  [employer] If Medicaid - County:  [employer] / Grade:   Maternity Care Coordinator / Child Services Coordination / Early Interventions:   CC4C  Cultural issues impacting care:   None stated    III  STRENGTHS Strengths  Adequate Resources  Compliance with medical plan  Home prepared for Child (including basic supplies)  Other - See comment  Supportive family/friends  Understanding of illness   Strength comment:  Pediatric follow up will be at [facility]   IV  RISK FACTORS AND CURRENT PROBLEMS Current Problem:  YES   Risk Factor & Current Problem Patient Issue Family Issue Risk Factor / Current Problem Comment  Mental Illness Y N Hx Dep/Anx   N N     V  SOCIAL WORK ASSESSMENT     VI SOCIAL WORK PLAN Social Work Plan  Psychosocial Support/Ongoing Assessment of Needs   Type of pt/family education:   PPD signs and symptoms  Ongoing support services offered by NICU CSW   If child protective services report - county:   If child protective services report -  date:   Information/referral to community resources comment:   Risk analyst   Other social work plan:

## 2013-03-17 LAB — RH IG WORKUP (INCLUDES ABO/RH)
ABO/RH(D): O NEG
Gestational Age(Wks): 40
Unit division: 0

## 2013-03-19 ENCOUNTER — Ambulatory Visit: Payer: Self-pay

## 2013-03-19 NOTE — Lactation Note (Signed)
This note was copied from the chart of Jasmine Roberts. Lactation Consultation Note   Follow up consult with this mom and baby, in NICU. Baby is term, and now 62 days old. Mom has a good milk supply already, at 60 ml's every 3 hours.  I assisted mom with latchign [name]  - mom has large, very soft breasts, with nipples that are also very soft, and flatten with lathing. A 24 nipple shield kept getting knocked off by the fussy baby. She did better with a 20 nipple shield, although this was small for mom. [name] would not suck unless the shield was filled with milk, so I set up an SNS. She took form the SNS slowly, and this was placed under the 20 nipple shield. The baby's feed was finished with a botttle. Mom is very patient, and will to work with transitioning [name] to breast. Mom knows this may happen after baby's discharge to home, in outpatient lactation.   Patient Name: Jasmine Roberts ZOXWR'U Date: 03/19/2013 Reason for consult: Follow-up assessment;NICU baby   Maternal Data    Feeding Feeding Type: Breast Milk (and formula) Nipple Type: Regular  LATCH Score/Interventions Latch: Repeated attempts needed to sustain latch, nipple held in mouth throughout feeding, stimulation needed to elicit sucking reflex. (24 nipple shiled would not stay on, 20 stayed better, SNS [place]) Intervention(s): Skin to skin;Teach feeding cues;Waking techniques Intervention(s): Adjust position;Assist with latch;Breast massage;Breast compression  Audible Swallowing: A few with stimulation  Type of Nipple: Flat (l;arge, soft breast, baby loses latch)  Comfort (Breast/Nipple): Soft / non-tender     Hold (Positioning): Assistance needed to correctly position infant at breast and maintain latch. Intervention(s): Breastfeeding basics reviewed;Support Pillows;Position options;Skin to skin  LATCH Score: 6  Lactation Tools Discussed/Used Tools: Nipple Shields Nipple shield size: 20   Consult  Status Consult Status: PRN Follow-up type: In-patient (in NICU)    Alfred Levins 03/19/2013, 4:32 PM

## 2013-09-26 ENCOUNTER — Telehealth (HOSPITAL_COMMUNITY): Payer: Self-pay | Admitting: *Deleted

## 2013-09-26 NOTE — Telephone Encounter (Signed)
Telephoned patient at home # and left message to return call to [facility] 

## 2013-10-10 ENCOUNTER — Encounter (HOSPITAL_COMMUNITY): Payer: Self-pay

## 2013-10-16 ENCOUNTER — Ambulatory Visit (HOSPITAL_COMMUNITY)
Admission: RE | Admit: 2013-10-16 | Discharge: 2013-10-16 | Disposition: A | Discharge status: Home / Self Care | Payer: Medicaid Other | Source: Ambulatory Visit | Attending: Obstetrics and Gynecology | Admitting: Obstetrics and Gynecology

## 2013-10-16 ENCOUNTER — Encounter (HOSPITAL_COMMUNITY): Payer: Self-pay

## 2013-10-16 ENCOUNTER — Encounter (INDEPENDENT_AMBULATORY_CARE_PROVIDER_SITE_OTHER): Payer: Self-pay

## 2013-10-16 VITALS — BP 124/84 | Temp 98.7°F | Ht 64.0 in | Wt 260.0 lb

## 2013-10-16 DIAGNOSIS — Z1239 Encounter for other screening for malignant neoplasm of breast: Secondary | ICD-10-CM

## 2013-10-16 DIAGNOSIS — R87612 Low grade squamous intraepithelial lesion on cytologic smear of cervix (LGSIL): Secondary | ICD-10-CM | POA: Insufficient documentation

## 2013-10-16 NOTE — Progress Notes (Signed)
[facility] to [facility] by [facility] due to an abnormal Pap smear on 05/01/2013 and a colposcopy is recommended for follow-up.  Pap Smear:  Pap smear not completed today. [facility]. [facility] for a colposcopy per recommendation for abnormal Pap smear. Appointment scheduled for Monday, Oct 22, 2013 at 1430. Per patient has a history of an abnormal Pap smear 01/16/2013 while pregnant. Both Pap smear result are scanned in EPIC under media.  Physical exam: Breasts Breasts symmetrical. No skin abnormalities bilateral breasts. No nipple retraction bilateral breasts. No nipple discharge bilateral breasts. No lymphadenopathy. No lumps palpated bilateral breasts. No complaints of pain or tenderness on exam. Screening mammogram recommended at age 27 unless clinically indicated prior.     Pelvic/Bimanual No Pap smear completed today since last Pap smear was 05/01/2013. Pap smear not indicated per [facility] guidelines.

## 2013-10-16 NOTE — Patient Instructions (Signed)
Taught Jasmine FairyCarolyn M Roberts how to perform BSE and gave educational materials to take home. Patient did not need a Pap smear today due to last Pap smear was 05/01/2013. [facility] for a colposcopy per recommendation for abnormal Pap smear. Appointment scheduled for Monday, Oct 22, 2013 at 1430. Patient aware of appointment and will be there. Jasmine Fairyarolyn M Roberts verbalized understanding.  Saintclair Halstedhristine Poll Roza Creamer, RN 2:02 PM

## 2013-10-22 ENCOUNTER — Encounter: Payer: Self-pay | Admitting: Family Medicine

## 2013-10-22 ENCOUNTER — Ambulatory Visit (INDEPENDENT_AMBULATORY_CARE_PROVIDER_SITE_OTHER): Payer: Self-pay | Admitting: Family Medicine

## 2013-10-22 ENCOUNTER — Other Ambulatory Visit (HOSPITAL_COMMUNITY)
Admission: RE | Admit: 2013-10-22 | Discharge: 2013-10-22 | Disposition: A | Discharge status: Home / Self Care | Payer: Self-pay | Source: Ambulatory Visit | Attending: Family Medicine | Admitting: Family Medicine

## 2013-10-22 VITALS — BP 130/90 | HR 103 | Temp 97.4°F | Ht 65.0 in | Wt 260.1 lb

## 2013-10-22 DIAGNOSIS — Z1239 Encounter for other screening for malignant neoplasm of breast: Secondary | ICD-10-CM | POA: Insufficient documentation

## 2013-10-22 DIAGNOSIS — Z3202 Encounter for pregnancy test, result negative: Secondary | ICD-10-CM

## 2013-10-22 DIAGNOSIS — R87612 Low grade squamous intraepithelial lesion on cytologic smear of cervix (LGSIL): Secondary | ICD-10-CM | POA: Insufficient documentation

## 2013-10-22 LAB — POCT PREGNANCY, URINE: Preg Test, Ur: NEGATIVE

## 2013-10-22 NOTE — Patient Instructions (Signed)
Abnormal Pap Test Information During a Pap test, the cells on the surface of your cervix are checked to see if they look normal, abnormal, or if they show signs of having been altered by a certain type of virus called human papillomavirus, or HPV. Cervical cells that have been affected by HPV are called dysplasia. Dysplasia is not cancer, but describes abnormal cells found on the surface of the cervix. Depending on the degree of dysplasia, some of the cells may be considered pre-cancerous and may turn into cancer over time if follow up with a caregiver is delayed.  WHAT DOES AN ABNORMAL PAP TEST MEAN? Having an abnormal pap test does not mean that you have cancer. However, certain types of abnormal pap tests can be a sign that a person is at a higher risk of developing cancer. Your caregiver will want to do other tests to find out more about the abnormal cells. Your abnormal Pap test results could show:   Small and uncertain changes that should be carefully watched.   Cervical dysplasia that has caused mild changes and can be followed over time.  Cervical dysplasia that is more severe and needs to be followed and treated to ensure the problem goes away.  Cancer.  When severe cervical dysplasia is found and treated early, it rarely will grow into cancer.  WHAT WILL BE DONE ABOUT MY ABNORMAL PAP TEST?  A colposcopy may be needed. This is a procedure where your cervix is examined using light and magnification.  A small tissue sample of your cervix (biopsy) may need to be removed and then examined. This is often performed if there are areas that appear infected.  A sample of cells from the cervical canal may be removed with either a small brush or scraping instrument (curette). Based on the results of the procedures above, some caregivers may recommend either cryotherapy of the cervix or a surgical LEEP where a portion of the cervix is removed. LEEP is short for "loop electrical excisional  procedure." Rarely, a caregiver may recommend a cone biopsy.This is a procedure where a small, cone-shaped sample of your cervix is taken out. The part that is taken out is the area where the abnormal cells are.  WHAT IF I HAVE A DYSPLASIA OR A CANCER? You may be referred to a specialist. Radiation may also be a treatment for more advanced cancer. Having a hysterectomy is the last treatment option for dysplasia, but it is a more common treatment for someone with cancer. All treatment options will be discussed with you by your caregiver. WHAT SHOULD YOU DO AFTER BEING TREATED? If you have had an abnormal pap test, you should continue to have regular pap tests and check-ups as directed by your caregiver. Your cervical problem will be carefully watched so it does not get worse. Also, your caregiver can watch for, and treat, any new problems that may come up. Document Released: 09/15/2010 Document Revised: 09/25/2012 Document Reviewed: 05/27/2011 ExitCare Patient Information 2014 [employer].  

## 2013-10-22 NOTE — Progress Notes (Signed)
Gen:  WD, WN, NAD, A&Ox3, pleasant LAB: HCG today is negative Colposcopy Procedure Note  Procedure: Colposcopy Consent: The risks and benefits of the procedure were discussed with the patient. Risks include acute and chronic abdominal and/or pelvic pain, infection (minimized by sterile technique), bleeding (internal and external), and need for subsequent procedure to correct a complication or further treat symptoms. Rarely, damage to internal structures may occur requiring subsequent procedures or, exceedingly rare, repair or removal of the uterus. Benefits may include further evaluation and diagnosis of cervical pathology. The alternatives were explained to the patient including: not doing procedure or postponing procedure. The disadvantages to not doing the procedure were discussed with the patent, including missed diagnosis of cervical cancer The written consent form has been signed and placed in the patient's medical record The patient voiced understanding of the procedure and agreed to proceed. Indication: Cervical dysplasia work up per ASCCP Guidelines Physicians: Dr. Casimiro NeedleMichael Jakwon Gayton Description in detail:   Chaperone present for exam Nash DimmerKerry A timeout was completed before the start of the procedure - site verified and documented in the chart.  External genitalia w/o visible lesions. Speculum exam revealed normal appearing cervix.  The cervix was examined using high powered microscopy  Acetic acid applied to cervix 6/9 with acetowhite changes  Lugol's iodine solution applied to cervix - acetowhite changes at 6/9 confirmed with lugols  The entire transformation zone / squamocolumnar junction was visualized.  Biopsies completed - see graphic  ECC completed EBL: Minimal, <253mL; hemostasis achieved prior to removing speculum Complications: None; Pt. tolerated procedure well. Instructions to patient: Return to clinic vs. ER, depending on severity, with fevers,   increasing pain, or difficulty  walking. Expect mild soreness and spotting for one day.  Call clinic with other questions. Post-procedure handout given to patient. Nothing in vagina for two weeks. Follow up plan: [facility] on PAP, now s/p colposcopy. Will call pt with results and follow ASCCP guidelines. NOTE: Bx and ECC pending

## 2013-10-23 LAB — POCT URINALYSIS DIP (DEVICE)
Bilirubin Urine: NEGATIVE
Glucose, UA: NEGATIVE mg/dL
Hgb urine dipstick: NEGATIVE
KETONES UR: NEGATIVE mg/dL
Leukocytes, UA: NEGATIVE
Nitrite: NEGATIVE
PH: 5.5 (ref 5.0–8.0)
Protein, ur: NEGATIVE mg/dL
Specific Gravity, Urine: <=1.005 (ref 1.005–1.030)
Urobilinogen, UA: 0.2 mg/dL (ref 0.0–1.0)

## 2013-10-24 ENCOUNTER — Encounter: Payer: Self-pay | Admitting: *Deleted

## 2013-10-30 ENCOUNTER — Telehealth: Payer: Self-pay

## 2013-10-30 ENCOUNTER — Encounter: Payer: Self-pay | Admitting: Family Medicine

## 2013-10-30 NOTE — Telephone Encounter (Signed)
Message copied by Louanna RawAMPBELL, Able Malloy M on Tue Oct 30, 2013 11:21 AM ------      Message from: Minta BalsamDOM, MICHAEL R      Created: Tue Oct 30, 2013  1:34 AM       Colpo Neg 10/2012 - pATIENT NEEDS COTESTING in 12 months. Please call and inform patient importance of this follow up.            Tawana ScaleMichael Ryan Odom, MD      OB Fellow       ------

## 2013-10-30 NOTE — Telephone Encounter (Signed)
Called pt. And informed of results and recommendation. Advised pt. To call March 2016 to schedule annual exam with pap and HPV testing. Pt. Verbalized understanding. No questions or concerns.

## 2014-04-12 ENCOUNTER — Telehealth (HOSPITAL_COMMUNITY): Payer: Self-pay | Admitting: *Deleted

## 2014-04-12 NOTE — Telephone Encounter (Signed)
Patient called me back. Explained the [facility] and that the one for $8.00 is not. Let her know she is responsible for that bill. Told patient if she gets any further bills for [facility] services to forward a copy to me. Patient verbalized understanding.

## 2014-04-12 NOTE — Telephone Encounter (Signed)
Attempted to call patient in regards to the bills she received for [facility] services. No one answered phone. Left voicemail for patient to call me back.

## 2014-04-15 ENCOUNTER — Encounter: Payer: Self-pay | Admitting: Family Medicine

## 2015-12-29 ENCOUNTER — Telehealth (HOSPITAL_COMMUNITY): Payer: Self-pay | Admitting: *Deleted

## 2015-12-29 NOTE — Telephone Encounter (Signed)
Telephoned patient at home # and left message to return call to [facility] 

## 2016-01-19 ENCOUNTER — Encounter (HOSPITAL_COMMUNITY): Payer: Self-pay | Admitting: *Deleted

## 2016-01-19 NOTE — Progress Notes (Signed)
Unable to reach patient by phone. Sent letter to remind patient to schedule pap smear

## 2017-04-04 ENCOUNTER — Ambulatory Visit (INDEPENDENT_AMBULATORY_CARE_PROVIDER_SITE_OTHER): Payer: BLUE CROSS/BLUE SHIELD | Admitting: Pulmonary Disease

## 2017-04-04 ENCOUNTER — Other Ambulatory Visit (INDEPENDENT_AMBULATORY_CARE_PROVIDER_SITE_OTHER): Payer: BLUE CROSS/BLUE SHIELD

## 2017-04-04 ENCOUNTER — Encounter: Payer: Self-pay | Admitting: Pulmonary Disease

## 2017-04-04 VITALS — BP 134/86 | HR 119 | Ht 64.0 in | Wt 301.6 lb

## 2017-04-04 DIAGNOSIS — G4733 Obstructive sleep apnea (adult) (pediatric): Secondary | ICD-10-CM | POA: Diagnosis not present

## 2017-04-04 DIAGNOSIS — R5383 Other fatigue: Secondary | ICD-10-CM | POA: Diagnosis not present

## 2017-04-04 LAB — CBC WITH DIFFERENTIAL/PLATELET
Basophils Absolute: 0.1 10*3/uL (ref 0.0–0.1)
Basophils Relative: 0.8 % (ref 0.0–3.0)
EOS PCT: 2.7 % (ref 0.0–5.0)
Eosinophils Absolute: 0.2 10*3/uL (ref 0.0–0.7)
HEMATOCRIT: 43.6 % (ref 36.0–46.0)
Hemoglobin: 14.6 g/dL (ref 12.0–15.0)
LYMPHS ABS: 2.3 10*3/uL (ref 0.7–4.0)
LYMPHS PCT: 27.1 % (ref 12.0–46.0)
MCHC: 33.4 g/dL (ref 30.0–36.0)
MCV: 94.6 fl (ref 78.0–100.0)
Monocytes Absolute: 0.7 10*3/uL (ref 0.1–1.0)
Monocytes Relative: 8 % (ref 3.0–12.0)
NEUTROS PCT: 61.4 % (ref 43.0–77.0)
Neutro Abs: 5.3 10*3/uL (ref 1.4–7.7)
Platelets: 283 10*3/uL (ref 150.0–400.0)
RBC: 4.61 Mil/uL (ref 3.87–5.11)
RDW: 13.3 % (ref 11.5–15.5)
WBC: 8.6 10*3/uL (ref 4.0–10.5)

## 2017-04-04 LAB — BASIC METABOLIC PANEL
BUN: 11 mg/dL (ref 6–23)
CALCIUM: 9.6 mg/dL (ref 8.4–10.5)
CHLORIDE: 101 meq/L (ref 96–112)
CO2: 31 meq/L (ref 19–32)
CREATININE: 0.71 mg/dL (ref 0.40–1.20)
GFR: 102.54 mL/min (ref >60.00)
Glucose, Bld: 125 mg/dL — ABNORMAL HIGH (ref 70–99)
Potassium: 4.6 mEq/L (ref 3.5–5.1)
Sodium: 139 mEq/L (ref 135–145)

## 2017-04-04 LAB — TSH: TSH: 2.9 u[IU]/mL (ref 0.35–4.50)

## 2017-04-04 NOTE — Patient Instructions (Signed)
Blood work including thyroid checked today. Home sleep study -based on this we will decide on the need for CPAP

## 2017-04-04 NOTE — Progress Notes (Signed)
Subjective:    Patient ID: Jasmine Roberts, female    DOB: May 04, 1986, 31 y.o.   MRN: [id number]  HPI  Chief Complaint  Patient presents with  . Sleep Consult    Pt sleeps around 8 hrs each night, but is up 5-6 times to use restroom or just cannot sleep well. Usual only sleep 2hrs. Pt has daytime sleepiness most everyday.     31 year old obese woman presents for evaluation of sleep-disordered breathing. Family members are reported loud snoring and she reports non-refreshing sleep in spite of good sleep quality. She was a work trip recently and her roommate told her that she snores loud and may have obstructive sleep apnea. She sleeps 9 hours every night but her fitbit States only 2.5 hours of restful sleep Epworth sleepiness score is 15 and she report sleepiness and radius social situations such as sitting and reading, lying down to rest in the afternoon, sitting quietly after lunch. 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 Bedtime is between 8:39 PM, she is a 31-year-old toddler at home, sleep latency is between 10:30 minutes, she sleeps on her side with 2 pillows, reports 4-5 nocturnal awakenings including nocturia and is out of bed by 6 AM feeling tired with dryness of mouth and occasional headache. He takes are almost 2 hours to be fully awake and going. She's gained about 60 pounds over the last 2 years that she attributes to stress eating. She denies caffeinated beverages but drinks at least 2 diet sodas per day  There is no history suggestive of cataplexy, sleep paralysis or parasomnias      Past Medical History:  Diagnosis Date  . Anxiety   . Depression   . Late prenatal care   . Obesity    Past Surgical History:  Procedure Laterality Date  . WISDOM TOOTH EXTRACTION     No Known Allergies  Social History   Social History  . Marital status: Single    Spouse name: N/A  . Number of children: N/A  . Years of education: N/A    Occupational History  . Not on file.   Social History Main Topics  . Smoking status: Former Smoker    Packs/day: 0.50    Years: 10.00    Quit date: 02/26/2013  . Smokeless tobacco: Never Used  . Alcohol use Yes     Comment: beer once a week  . Drug use: No  . Sexual activity: Yes    Birth control/ protection: None   Other Topics Concern  . Not on file   Social History Narrative  . No narrative on file     Family History  Problem Relation Age of Onset  . Heart disease Father   . Cancer Mother        melanoma  . Miscarriages / [place] Mother   . Cancer Maternal Grandmother        skin  . Diabetes Paternal Grandfather   . Alcohol abuse Neg Hx   . Arthritis Neg Hx   . Asthma Neg Hx   . Birth defects Neg Hx   . COPD Neg Hx   . Depression Neg Hx   . Drug abuse Neg Hx   . Early death Neg Hx   . Hearing loss Neg Hx   . Hyperlipidemia Neg Hx   . Hypertension Neg Hx   . Kidney disease Neg Hx   . Learning disabilities Neg Hx   .  Mental illness Neg Hx   . Mental retardation Neg Hx   . Stroke Neg Hx   . Vision loss Neg Hx      Review of Systems  Constitutional: Positive for unexpected weight change. Negative for fever.  HENT: Negative for congestion, dental problem, ear pain, nosebleeds, postnasal drip, rhinorrhea, sinus pressure, sneezing, sore throat and trouble swallowing.   Eyes: Negative for redness and itching.  Respiratory: Negative for cough, chest tightness, shortness of breath and wheezing.   Cardiovascular: Negative for palpitations and leg swelling.  Gastrointestinal: Negative for nausea and vomiting.  Genitourinary: Negative for dysuria.  Musculoskeletal: Negative for joint swelling.  Skin: Negative for rash.  Allergic/Immunologic: Negative.  Negative for environmental allergies, food allergies and immunocompromised state.  Neurological: Positive for headaches.  Hematological: Does not bruise/bleed easily.  Psychiatric/Behavioral: Negative for  dysphoric mood. The patient is nervous/anxious.        Objective:   Physical Exam  Gen. Pleasant, obese, in no distress, normal affect ENT - enlarged tonsils, no post nasal drip, class 2-3 airway Neck: No JVD, no thyromegaly, no carotid bruits Lungs: no use of accessory muscles, no dullness to percussion, decreased without rales or rhonchi  Cardiovascular: Rhythm regular, heart sounds  normal, no murmurs or gallops, no peripheral edema Abdomen: soft and non-tender, no hepatosplenomegaly, BS normal. Musculoskeletal: No deformities, no cyanosis or clubbing Neuro:  alert, non focal, no tremors       Assessment & Plan:

## 2017-04-04 NOTE — Assessment & Plan Note (Addendum)
Given excessive daytime somnolence, narrow pharyngeal exam, witnessed apneas & loud snoring, obstructive sleep apnea is very likely & an overnight polysomnogram will be scheduled as a home study. The pathophysiology of obstructive sleep apnea , it's cardiovascular consequences & modes of treatment including CPAP were discused with the patient in detail & they evidenced understanding.  Pretest probability is high She does have enlarged tonsils but doubt that tonsillectomy would solve her problems

## 2017-04-05 ENCOUNTER — Institutional Professional Consult (permissible substitution): Payer: Self-pay | Admitting: Pulmonary Disease

## 2017-05-02 DIAGNOSIS — G4733 Obstructive sleep apnea (adult) (pediatric): Secondary | ICD-10-CM | POA: Diagnosis not present

## 2017-05-03 DIAGNOSIS — G4733 Obstructive sleep apnea (adult) (pediatric): Secondary | ICD-10-CM | POA: Diagnosis not present

## 2017-05-04 ENCOUNTER — Telehealth: Payer: Self-pay | Admitting: Pulmonary Disease

## 2017-05-04 DIAGNOSIS — G4733 Obstructive sleep apnea (adult) (pediatric): Secondary | ICD-10-CM

## 2017-05-04 NOTE — Telephone Encounter (Signed)
Per RA the Pt has severe OSA 48/hr, in need of a cpap machine set up.   Called and spoke with patient today regarding results per ra, pt has severe sleep apnea in need of new cpap machine set up with mask, supplies, and enrolled into [employer].   Informed the patient of her results today and recommendations per ra. The patient verbalized understanding and denied any questions or concerns at this time. [facility] this [facility] Nothing further needed.

## 2017-05-13 ENCOUNTER — Other Ambulatory Visit: Payer: Self-pay | Admitting: *Deleted

## 2017-05-13 DIAGNOSIS — G4733 Obstructive sleep apnea (adult) (pediatric): Secondary | ICD-10-CM

## 2019-08-31 ENCOUNTER — Ambulatory Visit: Payer: Self-pay | Attending: Internal Medicine

## 2019-08-31 DIAGNOSIS — Z23 Encounter for immunization: Secondary | ICD-10-CM

## 2019-08-31 NOTE — Progress Notes (Signed)
   Covid-19 Vaccination Clinic  Name:  Jasmine Roberts    MRN: [id number] DOB: 03-16-1986  08/31/2019  [name] was observed post Covid-19 immunization for 15 minutes without incident. She was provided with Vaccine Information Sheet and instruction to access the V-Safe system.   [name] was instructed to call 911 with any severe reactions post vaccine: Marland Kitchen Difficulty breathing  . Swelling of face and throat  . A fast heartbeat  . A bad rash all over body  . Dizziness and weakness   Immunizations Administered    Name Date Dose VIS Date Route   Pfizer COVID-19 Vaccine 08/31/2019  3:44 PM 0.3 mL 05/25/2019 Intramuscular   Manufacturer: [employer], [employer]   Lot: [id number]   NDC: [address]-5

## 2019-09-26 ENCOUNTER — Ambulatory Visit: Payer: Self-pay | Attending: Internal Medicine

## 2019-09-26 DIAGNOSIS — Z23 Encounter for immunization: Secondary | ICD-10-CM

## 2019-09-26 NOTE — Progress Notes (Signed)
   [facility]ov[facility] Vaccination [facility]  Name:  Jasmine Roberts    MRN: [id number] DOB: 16-Jul-1985  09/26/2019  Jasmine Roberts was observed post Covid-19 immunization for 15 minutes without incident. She was provided with Vaccine Information Sheet and instruction to access the V-Safe system.   Jasmine Roberts was instructed to call 911 with any severe reactions post vaccine: Marland Kitchen Difficulty breathing  . Swelling of face and throat  . A fast heartbeat  . A bad rash all over body  . Dizziness and weakness   Immunizations Administered    Name Date Dose VIS Date Route   Pfizer COVID-19 Vaccine 09/26/2019  4:25 PM 0.3 mL 05/25/2019 Intramuscular   Manufacturer: [employer], [employer]   Lot: [id number]   NDC: [address]-9
